# Patient Record
Sex: Male | Born: 1990 | Race: Black or African American | Hispanic: No | Marital: Single | Smoking: Never smoker
Health system: Southern US, Community
[De-identification: ages and names within clinical notes are randomized; demographics above are authoritative.]

## PROBLEM LIST (undated history)

## (undated) DIAGNOSIS — J45909 Unspecified asthma, uncomplicated: Secondary | ICD-10-CM

---

## 2020-09-21 ENCOUNTER — Other Ambulatory Visit: Payer: Self-pay

## 2020-09-21 ENCOUNTER — Emergency Department
Admission: EM | Admit: 2020-09-21 | Discharge: 2020-09-21 | Disposition: A | Discharge status: Home / Self Care | Payer: Self-pay | Attending: Emergency Medicine | Admitting: Emergency Medicine

## 2020-09-21 DIAGNOSIS — A749 Chlamydial infection, unspecified: Secondary | ICD-10-CM | POA: Insufficient documentation

## 2020-09-21 LAB — URINALYSIS, COMPLETE (UACMP) WITH MICROSCOPIC
Bacteria, UA: NONE SEEN
Bilirubin Urine: NEGATIVE
Glucose, UA: NEGATIVE mg/dL
Hgb urine dipstick: NEGATIVE
Ketones, ur: NEGATIVE mg/dL
Leukocytes,Ua: NEGATIVE
Nitrite: NEGATIVE
Protein, ur: NEGATIVE mg/dL
Specific Gravity, Urine: 1.013 (ref 1.005–1.030)
pH: 9 — ABNORMAL HIGH (ref 5.0–8.0)

## 2020-09-21 LAB — CHLAMYDIA/NGC RT PCR (ARMC ONLY)
Chlamydia Tr: DETECTED — AB
N gonorrhoeae: NOT DETECTED

## 2020-09-21 MED ORDER — DOXYCYCLINE MONOHYDRATE 100 MG PO TABS: 100.0000 mg | ORAL_TABLET | Freq: Two times a day (BID) | ORAL | 0 refills | Status: AC

## 2020-09-21 MED ORDER — DOXYCYCLINE HYCLATE 100 MG PO TABS
100.0000 mg | ORAL_TABLET | Freq: Once | ORAL | Status: AC
  Administered 2020-09-21: 100 mg via ORAL
  Filled 2020-09-21: qty 1

## 2020-09-21 MED ORDER — CEFTRIAXONE SODIUM 1 G IJ SOLR
500.0000 mg | Freq: Once | INTRAMUSCULAR | Status: AC
  Administered 2020-09-21: 500 mg via INTRAMUSCULAR
  Filled 2020-09-21: qty 10

## 2020-09-21 NOTE — Discharge Instructions (Signed)
Please take all of your medications as prescribed.  Please abstain from sexual contact until you have finished treatment.  Please follow-up with the health department if you have any additional concerns or return to the ER for any worsening.

## 2020-09-21 NOTE — ED Provider Notes (Signed)
[facility] Emergency Department Provider Note  ____________________________________________   Event Date/Time   First MD Initiated Contact with Patient 09/21/20 1507     (approximate)  I have reviewed the triage vital signs and the nursing notes.   HISTORY  Chief Complaint STD check  HPI Elijah Braun is a 30 y.o. male who presents to the emergency department for evaluation of needing an STD check.  Patient denies any symptoms or known positive contacts.  States that he would just like testing for gonorrhea and chlamydia.  Specifically he denies any fever, abdominal pain, urethral discharge, scrotal swelling or pain.  Later, his significant other joints the room and states that she was positive, and and urged him to come get tested.        History reviewed. No pertinent past medical history.  There are no problems to display for this patient.   History reviewed. No pertinent surgical history.  Prior to Admission medications   Medication Sig Start Date End Date Taking? Authorizing Provider  doxycycline (ADOXA) 100 MG tablet Take 1 tablet (100 mg total) by mouth 2 (two) times daily for 7 days. 09/21/20 09/28/20 Yes Lucy Chris, PA    Allergies Patient has no allergy information on record.  No family history on file.  Social History    Review of Systems Constitutional: No fever/chills Eyes: No visual changes. ENT: No sore throat. Cardiovascular: Denies chest pain. Respiratory: Denies shortness of breath. Gastrointestinal: No abdominal pain.  No nausea, no vomiting.  No diarrhea.  No constipation. Genitourinary: Negative for dysuria. Musculoskeletal: Negative for back pain. Skin: Negative for rash. Neurological: Negative for headaches, focal weakness or numbness.  ____________________________________________   PHYSICAL EXAM:  VITAL SIGNS: ED Triage Vitals  Enc Vitals Group     BP 09/21/20 1350 129/84     Pulse Rate 09/21/20  1350 71     Resp 09/21/20 1350 17     Temp 09/21/20 1350 98.6 F (37 C)     Temp Source 09/21/20 1350 Oral     SpO2 09/21/20 1350 96 %     Weight 09/21/20 1351 275 lb (124.7 kg)     Height 09/21/20 1351 6\' 1"  (1.854 m)     Head Circumference --      Peak Flow --      Pain Score 09/21/20 1354 0     Pain Loc --      Pain Edu? --      Excl. in GC? --    Constitutional: Alert and oriented. Well appearing and in no acute distress. Eyes: Conjunctivae are normal. PERRL. EOMI. Head: Atraumatic. Nose: No congestion/rhinnorhea. Mouth/Throat: Mucous membranes are moist.  Oropharynx non-erythematous. Neck: No stridor.   Gastrointestinal: Soft and nontender. No distention. No abdominal bruits. Musculoskeletal: No lower extremity tenderness nor edema.  No joint effusions. Skin:  Skin is warm, dry and intact. No rash noted. Psychiatric: Mood and affect are normal. Speech and behavior are normal.  ____________________________________________   LABS (all labs ordered are listed, but only abnormal results are displayed)  Labs Reviewed  CHLAMYDIA/NGC RT PCR ([facility] ONLY) - Abnormal; Notable for the following components:      Result Value   Chlamydia Tr DETECTED (*)    All other components within normal limits  URINALYSIS, COMPLETE (UACMP) WITH MICROSCOPIC - Abnormal; Notable for the following components:   Color, Urine YELLOW (*)    APPearance CLEAR (*)    pH 9.0 (*)    All other components  within normal limits   ____________________________________________   INITIAL IMPRESSION / ASSESSMENT AND PLAN / ED COURSE  As part of my medical decision making, I reviewed the following data within the electronic MEDICAL RECORD NUMBER Nursing notes reviewed and incorporated and Notes from prior ED visits        Patient is a 30 year old male who presents to the emergency department for for evaluation of needing STD check.  Patient denies any symptoms.  His test is positive for chlamydia.  We  will cover him for any other potential exposure gonorrhea given that girlfriend also tested positive for STD.  Patient is amenable with this plan.  He was advised not to dissipate in any sexual contact until he has completed the 7-day course that he is amenable with plan.  He stable this time for outpatient follow-up.      ____________________________________________   FINAL CLINICAL IMPRESSION(S) / ED DIAGNOSES  Final diagnoses:  Chlamydia     ED Discharge Orders         Ordered    doxycycline (ADOXA) 100 MG tablet  2 times daily        09/21/20 1558          *Please note:  Elijah Braun was evaluated in Emergency Department on 09/21/2020 for the symptoms described in the history of present illness. He was evaluated in the context of the global COVID-19 pandemic, which necessitated consideration that the patient might be at risk for infection with the SARS-CoV-2 virus that causes COVID-19. Institutional protocols and algorithms that pertain to the evaluation of patients at risk for COVID-19 are in a state of rapid change based on information released by regulatory bodies including the CDC and federal and state organizations. These policies and algorithms were followed during the patient's care in the ED.  Some ED evaluations and interventions may be delayed as a result of limited staffing during and the pandemic.*   Note:  This document was prepared using Dragon voice recognition software and may include unintentional dictation errors.   Lucy Chris, PA 09/21/20 1615    Jene Every, MD 09/21/20 1630

## 2020-09-21 NOTE — ED Triage Notes (Signed)
Pt comes with needing STD check. Pt states no current symptoms.   Pt states he needs a check up.

## 2020-09-29 ENCOUNTER — Other Ambulatory Visit: Payer: Self-pay

## 2020-09-29 ENCOUNTER — Emergency Department
Admission: EM | Admit: 2020-09-29 | Discharge: 2020-09-29 | Disposition: A | Discharge status: Home / Self Care | Payer: Medicaid Other | Attending: Emergency Medicine | Admitting: Emergency Medicine

## 2020-09-29 ENCOUNTER — Encounter: Payer: Self-pay | Admitting: Emergency Medicine

## 2020-09-29 DIAGNOSIS — Z139 Encounter for screening, unspecified: Secondary | ICD-10-CM

## 2020-09-29 DIAGNOSIS — Z0000 Encounter for general adult medical examination without abnormal findings: Secondary | ICD-10-CM | POA: Insufficient documentation

## 2020-09-29 NOTE — ED Triage Notes (Signed)
Pt states seen 1 week ago and was dx with STD, states was treated for STD and was told to come back in a week and see if he still had an STD.

## 2020-09-29 NOTE — ED Provider Notes (Signed)
    [facility] Emergency Department Provider Note   ____________________________________________    I have reviewed the triage vital signs and the nursing notes.   HISTORY  Chief Complaint Follow-up     HPI Elijah Braun is a 30 y.o. male who reports he was seen and treated for an STD 7 days ago, he reports compliance with his antibiotics.  He is here for recheck, he notes that he is asymptomatic and is feeling well.  History reviewed. No pertinent past medical history.  There are no problems to display for this patient.   History reviewed. No pertinent surgical history.  Prior to Admission medications   Not on File     Allergies Orange oil  History reviewed. No pertinent family history.  Social History    Review of Systems  Constitutional: No fever/chills    Gastrointestinal: No abdominal pain.  No nausea, no vomiting.   Genitourinary: Negative for dysuria.  No discharge  Skin: Negative for rash.     ____________________________________________   PHYSICAL EXAM:  VITAL SIGNS: ED Triage Vitals [09/29/20 1250]  Enc Vitals Group     BP      Pulse      Resp      Temp      Temp src      SpO2      Weight 124.7 kg (275 lb)     Height 1.854 m (6\' 1" )     Head Circumference      Peak Flow      Pain Score 0     Pain Loc      Pain Edu?      Excl. in GC?      Constitutional: Alert and oriented. No acute distress. Pleasant and interactive  Mouth/Throat: Mucous membranes are moist.   Cardiovascular: Normal rate, regular rhythm.  Respiratory: Normal respiratory effort.  No retractions.  Musculoskeletal: No lower extremity tenderness nor edema.   Neurologic:  Normal speech and language. No gross focal neurologic deficits are appreciated.   Skin:  Skin is warm, dry and intact. No rash noted.   ____________________________________________   LABS (all labs ordered are listed, but only abnormal results are  displayed)  Labs Reviewed - No data to display ____________________________________________  EKG   ____________________________________________  RADIOLOGY  None ____________________________________________   PROCEDURES  Procedure(s) performed: No  Procedures   Critical Care performed: No ____________________________________________   INITIAL IMPRESSION / ASSESSMENT AND PLAN / ED COURSE  Pertinent labs & imaging results that were available during my care of the patient were reviewed by me and considered in my medical decision making (see chart for details).  Patient is asymptomatic, compliant with his medications, no further treatment necessary   ____________________________________________   FINAL CLINICAL IMPRESSION(S) / ED DIAGNOSES  Final diagnoses:  Encounter for medical screening examination      NEW MEDICATIONS STARTED DURING THIS VISIT:  There are no discharge medications for this patient.    Note:  This document was prepared using Dragon voice recognition software and may include unintentional dictation errors.   , MD 09/29/20 [phone]

## 2020-10-14 ENCOUNTER — Emergency Department: Payer: Medicaid Other

## 2020-10-14 ENCOUNTER — Emergency Department
Admission: EM | Admit: 2020-10-14 | Discharge: 2020-10-14 | Disposition: A | Discharge status: Home / Self Care | Payer: Medicaid Other | Attending: Emergency Medicine | Admitting: Emergency Medicine

## 2020-10-14 ENCOUNTER — Other Ambulatory Visit: Payer: Self-pay

## 2020-10-14 DIAGNOSIS — M545 Low back pain, unspecified: Secondary | ICD-10-CM | POA: Insufficient documentation

## 2020-10-14 DIAGNOSIS — M549 Dorsalgia, unspecified: Secondary | ICD-10-CM

## 2020-10-14 DIAGNOSIS — Y990 Civilian activity done for income or pay: Secondary | ICD-10-CM | POA: Insufficient documentation

## 2020-10-14 DIAGNOSIS — W010XXA Fall on same level from slipping, tripping and stumbling without subsequent striking against object, initial encounter: Secondary | ICD-10-CM | POA: Insufficient documentation

## 2020-10-14 DIAGNOSIS — M25551 Pain in right hip: Secondary | ICD-10-CM | POA: Insufficient documentation

## 2020-10-14 MED ORDER — NAPROXEN 500 MG PO TABS: 500.0000 mg | ORAL_TABLET | Freq: Two times a day (BID) | ORAL | 0 refills | Status: AC

## 2020-10-14 NOTE — ED Provider Notes (Signed)
[facility] Emergency Department Provider Note   ____________________________________________   Event Date/Time   First MD Initiated Contact with Patient 10/14/20 1229     (approximate)  I have reviewed the triage vital signs and the nursing notes.   HISTORY  Chief Complaint Fall    HPI Elijah Braun is a 30 y.o. male patient complain of a trip and fall 4 days ago at work.  Patient denies LOC or head injury.  Patient has pain to the lower back and right hip.  Patient denies radicular component to his back pain.  Patient denies bladder or bowel dysfunction.  Patient rates pain as 8/10.  Patient described pain as "sore".  No palliative measure for complaint.         History reviewed. No pertinent past medical history.  There are no problems to display for this patient.   History reviewed. No pertinent surgical history.  Prior to Admission medications   Medication Sig Start Date End Date Taking? Authorizing Provider  naproxen (NAPROSYN) 500 MG tablet Take 1 tablet (500 mg total) by mouth 2 (two) times daily with a meal. 10/14/20  Yes Joni Reining, PA-C    Allergies Orange oil  No family history on file.  Social History    Review of Systems Constitutional: No fever/chills Eyes: No visual changes. ENT: No sore throat. Cardiovascular: Denies chest pain. Respiratory: Denies shortness of breath. Gastrointestinal: No abdominal pain.  No nausea, no vomiting.  No diarrhea.  No constipation. Genitourinary: Negative for dysuria. Musculoskeletal: Positive for right hip and back pain. Skin: Negative for rash. Neurological: Negative for headaches, focal weakness or numbness.   ____________________________________________   PHYSICAL EXAM:  VITAL SIGNS: ED Triage Vitals  Enc Vitals Group     BP 10/14/20 1232 (!) 135/92     Pulse Rate 10/14/20 1232 64     Resp 10/14/20 1232 19     Temp 10/14/20 1232 (!) 97.5 F (36.4 C)     Temp src --       SpO2 10/14/20 1232 98 %     Weight --      Height --      Head Circumference --      Peak Flow --      Pain Score 10/14/20 1231 8     Pain Loc --      Pain Edu? --      Excl. in GC? --    Constitutional: Alert and oriented. Well appearing and in no acute distress. Eyes: Conjunctivae are normal. PERRL. EOMI. Head: Atraumatic. Nose: No congestion/rhinnorhea. Mouth/Throat: Mucous membranes are moist.  Oropharynx non-erythematous. Neck:No cervical spine tenderness to palpation. Hematological/Lymphatic/Immunilogical: No cervical lymphadenopathy. Cardiovascular: Normal rate, regular rhythm. Grossly normal heart sounds.  Good peripheral circulation. Respiratory: Normal respiratory effort.  No retractions. Lungs CTAB. Gastrointestinal: Soft and nontender. No distention. No abdominal bruits. No CVA tenderness. Genitourinary: Deferred Musculoskeletal: No lower extremity tenderness nor edema.  No joint effusions. Neurologic:  Normal speech and language. No gross focal neurologic deficits are appreciated. No gait instability. Skin:  Skin is warm, dry and intact. No rash noted. Psychiatric: Mood and affect are normal. Speech and behavior are normal.  ____________________________________________   LABS (all labs ordered are listed, but only abnormal results are displayed)  Labs Reviewed - No data to display ____________________________________________  EKG   ____________________________________________  RADIOLOGY I, Joni Reining, personally viewed and evaluated these images (plain radiographs) as part of my medical decision making, as well as  reviewing the written report by the radiologist.  ED MD interpretation: No acute findings on x-ray of the right hip.  Official radiology report(s): DG Hip Unilat W or Wo Pelvis 2-3 Views Right  Result Date: 10/14/2020 CLINICAL DATA:  Follow-up work.  Right hip and lower back pain. EXAM: DG HIP (WITH OR WITHOUT PELVIS) 2-3V RIGHT  COMPARISON:  None. FINDINGS: No definite fracture. SI joints and symphysis pubis unremarkable. Small fragment along the roof of the right acetabulum laterally is probably an os acetabuli right femoral neck intact. IMPRESSION: No definite findings of acute fracture. 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 Electronically Signed   By: Kennith Center M.D.   On: 10/14/2020 13:25    ____________________________________________   PROCEDURES  Procedure(s) performed (including Critical Care):  Procedures   ____________________________________________   INITIAL IMPRESSION / ASSESSMENT AND PLAN / ED COURSE  As part of my medical decision making, I reviewed the following data within the electronic MEDICAL RECORD NUMBER         Patient complain right lateral back and right hip pain secondary to a trip and fall 4 days ago.  Discussed no acute findings x-rays with patient.  Patient complaining physical exam consistent muscle skeletal pain secondary to fall.  Patient given discharge care instruction work note.  Patient advised take anti-inflammatory medication as directed.  Patient [facility].      ____________________________________________   FINAL CLINICAL IMPRESSION(S) / ED DIAGNOSES  Final diagnoses:  Musculoskeletal back pain     ED Discharge Orders         Ordered    naproxen (NAPROSYN) 500 MG tablet  2 times daily with meals        10/14/20 1338          *Please note:  Elijah Braun was evaluated in Emergency Department on 10/14/2020 for the symptoms described in the history of present illness. He was evaluated in the context of the global COVID-19 pandemic, which necessitated consideration that the patient might be at risk for infection with the SARS-CoV-2 virus that causes COVID-19. Institutional protocols and algorithms that pertain to the evaluation of patients at risk for COVID-19 are in a state of rapid change based on information released by  regulatory bodies including the CDC and federal and state organizations. These policies and algorithms were followed during the patient's care in the ED.  Some ED evaluations and interventions may be delayed as a result of limited staffing during and the pandemic.*   Note:  This document was prepared using Dragon voice recognition software and may include unintentional dictation errors.    Joni Reining, PA-C 10/14/20 1340    Minna Antis, MD 10/14/20 1529

## 2020-10-14 NOTE — Discharge Instructions (Signed)
No acute findings on x-ray of the right hip.  Read and follow discharge care instruction.  Take medication as needed.

## 2020-10-14 NOTE — ED Notes (Signed)
See triage note  States he tripped and fell on Friday     Having lower back pain into right hip

## 2020-10-14 NOTE — ED Triage Notes (Signed)
Pt comes with c/o trip and fall last Friday while at work. Pt states this was already filed at work for workers comp.  Pt states no head injury. Pt states lower back pain.

## 2020-10-29 ENCOUNTER — Other Ambulatory Visit: Payer: Self-pay

## 2020-10-29 ENCOUNTER — Emergency Department
Admission: EM | Admit: 2020-10-29 | Discharge: 2020-10-29 | Disposition: A | Discharge status: Home / Self Care | Payer: Medicaid Other | Attending: Emergency Medicine | Admitting: Emergency Medicine

## 2020-10-29 DIAGNOSIS — Z20822 Contact with and (suspected) exposure to covid-19: Secondary | ICD-10-CM | POA: Insufficient documentation

## 2020-10-29 DIAGNOSIS — R197 Diarrhea, unspecified: Secondary | ICD-10-CM | POA: Insufficient documentation

## 2020-10-29 DIAGNOSIS — R112 Nausea with vomiting, unspecified: Secondary | ICD-10-CM | POA: Insufficient documentation

## 2020-10-29 LAB — COMPREHENSIVE METABOLIC PANEL
ALT: 30 U/L (ref 0–44)
AST: 21 U/L (ref 15–41)
Albumin: 3.7 g/dL (ref 3.5–5.0)
Alkaline Phosphatase: 77 U/L (ref 38–126)
Anion gap: 7 (ref 5–15)
BUN: 13 mg/dL (ref 6–20)
CO2: 25 mmol/L (ref 22–32)
Calcium: 9.3 mg/dL (ref 8.9–10.3)
Chloride: 108 mmol/L (ref 98–111)
Creatinine, Ser: 1.14 mg/dL (ref 0.61–1.24)
GFR, Estimated: >60 mL/min (ref >60)
Glucose, Bld: 118 mg/dL — ABNORMAL HIGH (ref 70–99)
Potassium: 3.8 mmol/L (ref 3.5–5.1)
Sodium: 140 mmol/L (ref 135–145)
Total Bilirubin: 0.5 mg/dL (ref 0.3–1.2)
Total Protein: 7.5 g/dL (ref 6.5–8.1)

## 2020-10-29 LAB — URINALYSIS, COMPLETE (UACMP) WITH MICROSCOPIC
Bacteria, UA: NONE SEEN
Bilirubin Urine: NEGATIVE
Glucose, UA: NEGATIVE mg/dL
Hgb urine dipstick: NEGATIVE
Ketones, ur: NEGATIVE mg/dL
Leukocytes,Ua: NEGATIVE
Nitrite: NEGATIVE
Protein, ur: NEGATIVE mg/dL
Specific Gravity, Urine: 1.013 (ref 1.005–1.030)
pH: 5 (ref 5.0–8.0)

## 2020-10-29 LAB — CBC
HCT: 40 % (ref 39.0–52.0)
Hemoglobin: 13.4 g/dL (ref 13.0–17.0)
MCH: 27.6 pg (ref 26.0–34.0)
MCHC: 33.5 g/dL (ref 30.0–36.0)
MCV: 82.5 fL (ref 80.0–100.0)
Platelets: 263 10*3/uL (ref 150–400)
RBC: 4.85 MIL/uL (ref 4.22–5.81)
RDW: 14.2 % (ref 11.5–15.5)
WBC: 7.1 10*3/uL (ref 4.0–10.5)
nRBC: 0 % (ref 0.0–0.2)

## 2020-10-29 LAB — LIPASE, BLOOD: Lipase: 39 U/L (ref 11–51)

## 2020-10-29 LAB — RESP PANEL BY RT-PCR (FLU A&B, COVID) ARPGX2
Influenza A by PCR: NEGATIVE
Influenza B by PCR: NEGATIVE
SARS Coronavirus 2 by RT PCR: NEGATIVE

## 2020-10-29 MED ORDER — LACTATED RINGERS IV BOLUS
1000.0000 mL | Freq: Once | INTRAVENOUS | Status: AC
  Administered 2020-10-29: 1000 mL via INTRAVENOUS

## 2020-10-29 MED ORDER — ONDANSETRON 4 MG PO TBDP: 4.0000 mg | ORAL_TABLET | Freq: Three times a day (TID) | ORAL | 0 refills | Status: AC | PRN

## 2020-10-29 MED ORDER — ONDANSETRON HCL 4 MG/2ML IJ SOLN
4.0000 mg | Freq: Once | INTRAMUSCULAR | Status: AC
  Administered 2020-10-29: 4 mg via INTRAVENOUS
  Filled 2020-10-29: qty 2

## 2020-10-29 MED ORDER — IBUPROFEN 600 MG PO TABS: 600.0000 mg | ORAL_TABLET | Freq: Three times a day (TID) | ORAL | 0 refills | Status: AC | PRN

## 2020-10-29 NOTE — ED Triage Notes (Signed)
Pt c/o N/V/D for the past couple of days, denies any abd pain... pt is ambulatory with a steady gait

## 2020-10-29 NOTE — ED Provider Notes (Addendum)
[facility] Emergency Department Provider Note  ____________________________________________   Event Date/Time   First MD Initiated Contact with Patient 10/29/20 1001     (approximate)  I have reviewed the triage vital signs and the nursing notes.   HISTORY  Chief Complaint Emesis and Diarrhea    HPI Elijah Braun is a 30 y.o. male here with nausea, vomiting, diarrhea.  The patient states that for the last 2 days, he has had nausea, vomiting, diarrhea.  He said diffuse, mild, abdominal cramping.  He states he has been very fatigued.  He has no known sick contacts.  He has not taken anything for symptoms.  He states that he is here because he would like to be checked out.  Denies any recent travel.  He actually feels somewhat better today and has been able to tolerate some p.o. intake.  No other complaints.        History reviewed. No pertinent past medical history.  There are no problems to display for this patient.   History reviewed. No pertinent surgical history.  Prior to Admission medications   Medication Sig Start Date End Date Taking? Authorizing Provider  naproxen (NAPROSYN) 500 MG tablet Take 1 tablet (500 mg total) by mouth 2 (two) times daily with a meal. 10/14/20   Joni Reining, PA-C    Allergies Orange oil  No family history on file.  Social History Social History   Tobacco Use  . Smoking status: Never Smoker  . Smokeless tobacco: Never Used    Review of Systems  Review of Systems  Constitutional: Positive for fatigue. Negative for chills and fever.  HENT: Negative for sore throat.   Respiratory: Negative for shortness of breath.   Cardiovascular: Negative for chest pain.  Gastrointestinal: Positive for abdominal pain, diarrhea, nausea and vomiting.  Genitourinary: Negative for flank pain.  Musculoskeletal: Negative for neck pain.  Skin: Negative for rash and wound.  Allergic/Immunologic: Negative for  immunocompromised state.  Neurological: Positive for weakness. Negative for numbness.  Hematological: Does not bruise/bleed easily.  All other systems reviewed and are negative.    ____________________________________________  PHYSICAL EXAM:      VITAL SIGNS: ED Triage Vitals  Enc Vitals Group     BP 10/29/20 0812 (!) 128/91     Pulse Rate 10/29/20 0812 65     Resp 10/29/20 0812 18     Temp 10/29/20 0812 98 F (36.7 C)     Temp Source 10/29/20 0812 Oral     SpO2 10/29/20 0812 97 %     Weight --      Height --      Head Circumference --      Peak Flow --      Pain Score 10/29/20 0809 0     Pain Loc --      Pain Edu? --      Excl. in GC? --      Physical Exam Vitals and nursing note reviewed.  Constitutional:      General: He is not in acute distress.    Appearance: He is well-developed.  HENT:     Head: Normocephalic and atraumatic.  Eyes:     Conjunctiva/sclera: Conjunctivae normal.  Cardiovascular:     Rate and Rhythm: Normal rate and regular rhythm.     Heart sounds: Normal heart sounds. No murmur heard. No friction rub.  Pulmonary:     Effort: Pulmonary effort is normal. No respiratory distress.     Breath  sounds: Normal breath sounds. No wheezing or rales.  Abdominal:     General: There is no distension.     Palpations: Abdomen is soft.     Tenderness: There is no abdominal tenderness (Minimal, generalized, no focal tenderness).  Musculoskeletal:     Cervical back: Neck supple.  Skin:    General: Skin is warm.     Capillary Refill: Capillary refill takes less than 2 seconds.  Neurological:     Mental Status: He is alert and oriented to person, place, and time.     Motor: No abnormal muscle tone.       ____________________________________________   LABS (all labs ordered are listed, but only abnormal results are displayed)  Labs Reviewed  COMPREHENSIVE METABOLIC PANEL - Abnormal; Notable for the following components:      Result Value    Glucose, Bld 118 (*)    All other components within normal limits  URINALYSIS, COMPLETE (UACMP) WITH MICROSCOPIC - Abnormal; Notable for the following components:   Color, Urine YELLOW (*)    APPearance CLEAR (*)    All other components within normal limits  RESP PANEL BY RT-PCR (FLU A&B, COVID) ARPGX2  LIPASE, BLOOD  CBC    ____________________________________________  EKG:  ________________________________________  RADIOLOGY All imaging, including plain films, CT scans, and ultrasounds, independently reviewed by me, and interpretations confirmed via formal radiology reads.  ED MD interpretation:     Official radiology report(s): No results found.  ____________________________________________  PROCEDURES   Procedure(s) performed (including Critical Care):  Procedures  ____________________________________________  INITIAL IMPRESSION / MDM / ASSESSMENT AND PLAN / ED COURSE  As part of my medical decision making, I reviewed the following data within the electronic MEDICAL RECORD NUMBER Nursing notes reviewed and incorporated, Old chart reviewed, Notes from prior ED visits, and [place] Controlled Substance Database       *Elijah Braun was evaluated in Emergency Department on 10/29/2020 for the symptoms described in the history of present illness. He was evaluated in the context of the global COVID-19 pandemic, which necessitated consideration that the patient might be at risk for infection with the SARS-CoV-2 virus that causes COVID-19. Institutional protocols and algorithms that pertain to the evaluation of patients at risk for COVID-19 are in a state of rapid change based on information released by regulatory bodies including the CDC and federal and state organizations. These policies and algorithms were followed during the patient's care in the ED.  Some ED evaluations and interventions may be delayed as a result of limited staffing during the pandemic.*     Medical Decision  Making: Well-appearing 30 year old male here with generalized abdominal pain, nausea, and vomiting.  Abdomen is soft and nontender on my exam.  Lab work reviewed, is very reassuring.  He has no significant leukocytosis.  CMP unremarkable.  Renal function at baseline.  UA without signs of UTI or stone.  COVID negative.  Patient feels much better after symptomatic treatment.  He has no focal tenderness on exam to suggest appendicitis, cholecystitis, or other surgical abnormality.  Patient will be discharged with supportive care and outpatient follow-up.  ____________________________________________  FINAL CLINICAL IMPRESSION(S) / ED DIAGNOSES  Final diagnoses:  Nausea vomiting and diarrhea     MEDICATIONS GIVEN DURING THIS VISIT:  Medications  lactated ringers bolus 1,000 mL (0 mLs Intravenous Stopped 10/29/20 1205)  ondansetron (ZOFRAN) injection 4 mg (4 mg Intravenous Given 10/29/20 1050)     ED Discharge Orders    None  Note:  This document was prepared using Dragon voice recognition software and may include unintentional dictation errors.   Shaune Pollack, MD 10/29/20 1248    Shaune Pollack, MD 10/29/20 1249

## 2020-11-29 ENCOUNTER — Emergency Department
Admission: EM | Admit: 2020-11-29 | Discharge: 2020-11-29 | Disposition: A | Discharge status: Home / Self Care | Payer: Medicaid Other | Attending: Emergency Medicine | Admitting: Emergency Medicine

## 2020-11-29 ENCOUNTER — Other Ambulatory Visit: Payer: Self-pay

## 2020-11-29 DIAGNOSIS — Z5321 Procedure and treatment not carried out due to patient leaving prior to being seen by health care provider: Secondary | ICD-10-CM | POA: Insufficient documentation

## 2020-11-29 DIAGNOSIS — R109 Unspecified abdominal pain: Secondary | ICD-10-CM | POA: Insufficient documentation

## 2020-11-29 DIAGNOSIS — R197 Diarrhea, unspecified: Secondary | ICD-10-CM | POA: Insufficient documentation

## 2020-11-29 HISTORY — DX: Unspecified asthma, uncomplicated: J45.909

## 2020-11-29 LAB — COMPREHENSIVE METABOLIC PANEL
ALT: 77 U/L — ABNORMAL HIGH (ref 0–44)
AST: 35 U/L (ref 15–41)
Albumin: 4.4 g/dL (ref 3.5–5.0)
Alkaline Phosphatase: 79 U/L (ref 38–126)
Anion gap: 9 (ref 5–15)
BUN: 8 mg/dL (ref 6–20)
CO2: 25 mmol/L (ref 22–32)
Calcium: 9.5 mg/dL (ref 8.9–10.3)
Chloride: 106 mmol/L (ref 98–111)
Creatinine, Ser: 1.04 mg/dL (ref 0.61–1.24)
GFR, Estimated: >60 mL/min (ref >60)
Glucose, Bld: 130 mg/dL — ABNORMAL HIGH (ref 70–99)
Potassium: 3.7 mmol/L (ref 3.5–5.1)
Sodium: 140 mmol/L (ref 135–145)
Total Bilirubin: 0.5 mg/dL (ref 0.3–1.2)
Total Protein: 8.7 g/dL — ABNORMAL HIGH (ref 6.5–8.1)

## 2020-11-29 LAB — URINALYSIS, COMPLETE (UACMP) WITH MICROSCOPIC
Bacteria, UA: NONE SEEN
Bilirubin Urine: NEGATIVE
Glucose, UA: NEGATIVE mg/dL
Ketones, ur: NEGATIVE mg/dL
Leukocytes,Ua: NEGATIVE
Nitrite: NEGATIVE
Protein, ur: NEGATIVE mg/dL
Specific Gravity, Urine: 1.012 (ref 1.005–1.030)
Squamous Epithelial / HPF: NONE SEEN (ref 0–5)
pH: 5 (ref 5.0–8.0)

## 2020-11-29 LAB — CBC
HCT: 44.5 % (ref 39.0–52.0)
Hemoglobin: 14.5 g/dL (ref 13.0–17.0)
MCH: 27.6 pg (ref 26.0–34.0)
MCHC: 32.6 g/dL (ref 30.0–36.0)
MCV: 84.8 fL (ref 80.0–100.0)
Platelets: 311 10*3/uL (ref 150–400)
RBC: 5.25 MIL/uL (ref 4.22–5.81)
RDW: 13.9 % (ref 11.5–15.5)
WBC: 6.7 10*3/uL (ref 4.0–10.5)
nRBC: 0 % (ref 0.0–0.2)

## 2020-11-29 LAB — LIPASE, BLOOD: Lipase: 27 U/L (ref 11–51)

## 2020-11-29 NOTE — ED Notes (Signed)
No answer when called for vital signs recheck.

## 2020-11-29 NOTE — ED Triage Notes (Signed)
Pt to ED POV for right sided abd pain that started 2 days ago. Started having diarrhea yesterday.  NAD noted

## 2021-07-09 IMAGING — CR DG HIP (WITH OR WITHOUT PELVIS) 2-3V*R*
3 series · 3 of 3 positions shown · non-contrast
Comparison: None.

CLINICAL DATA: Follow-up work.  Right hip and lower back pain.

EXAM:
DG HIP (WITH OR WITHOUT PELVIS) 2-3V RIGHT

[pelvis ap]
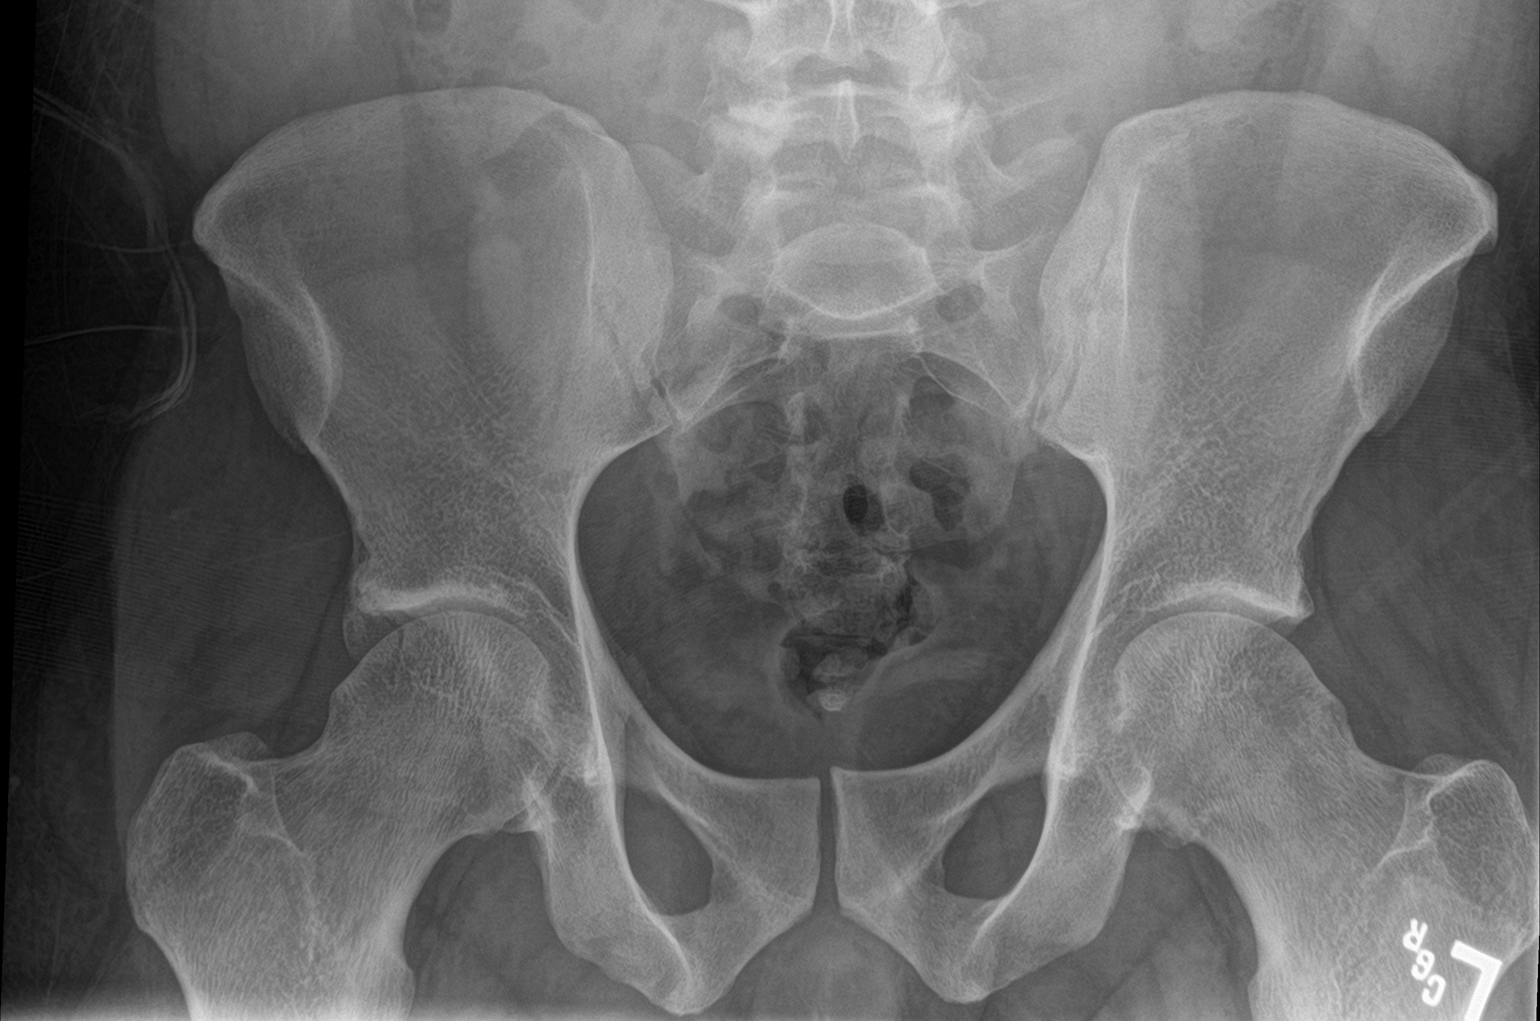

[hip ap]
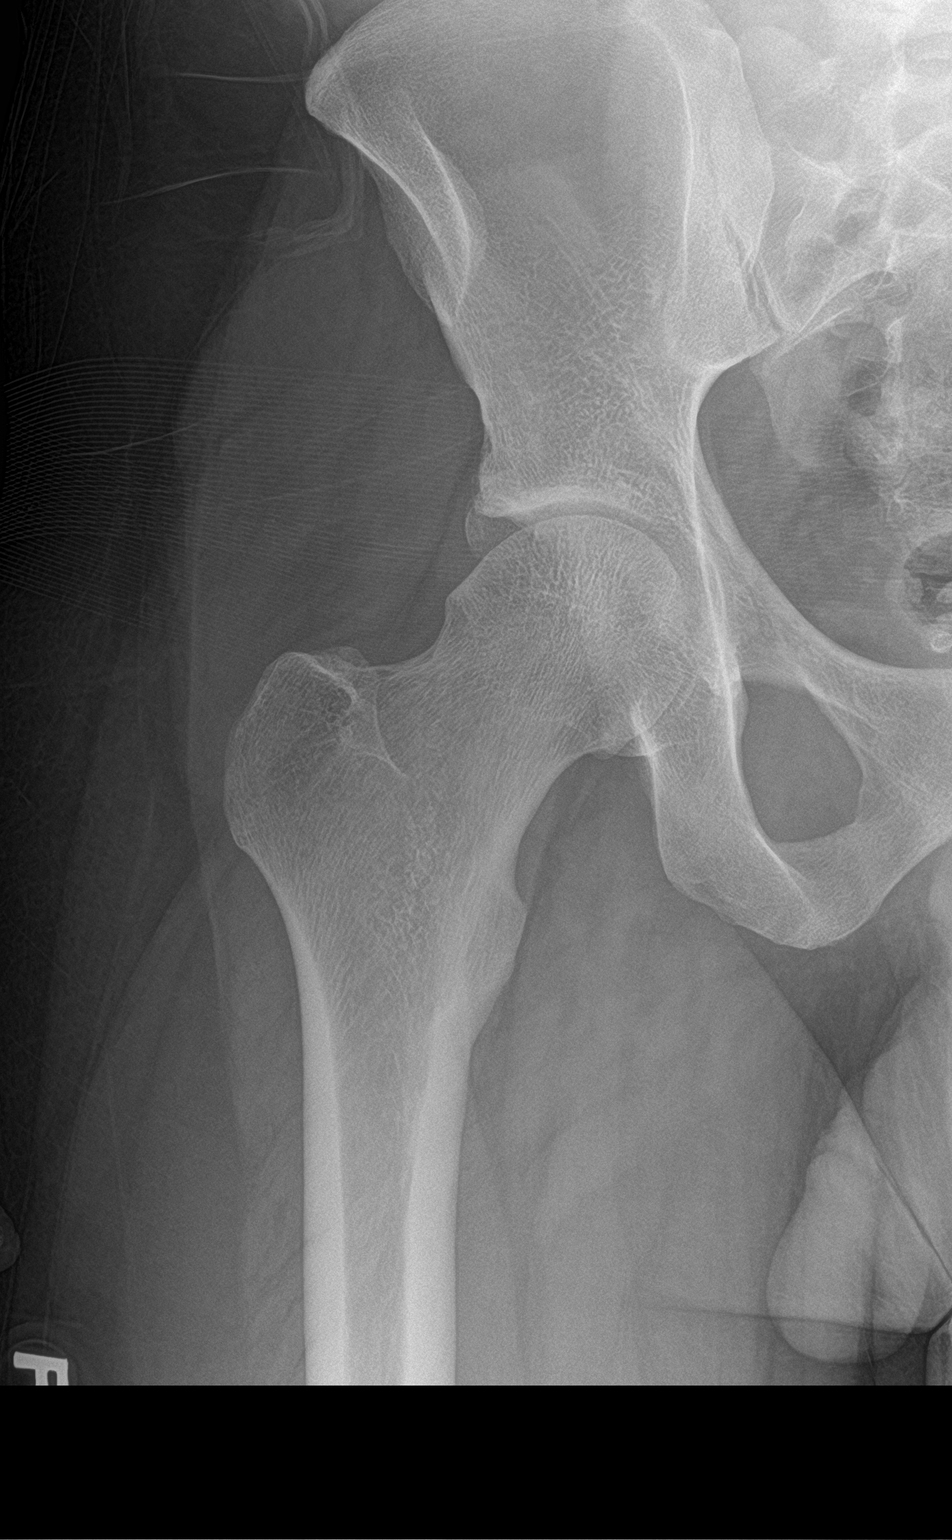

[hip lat]
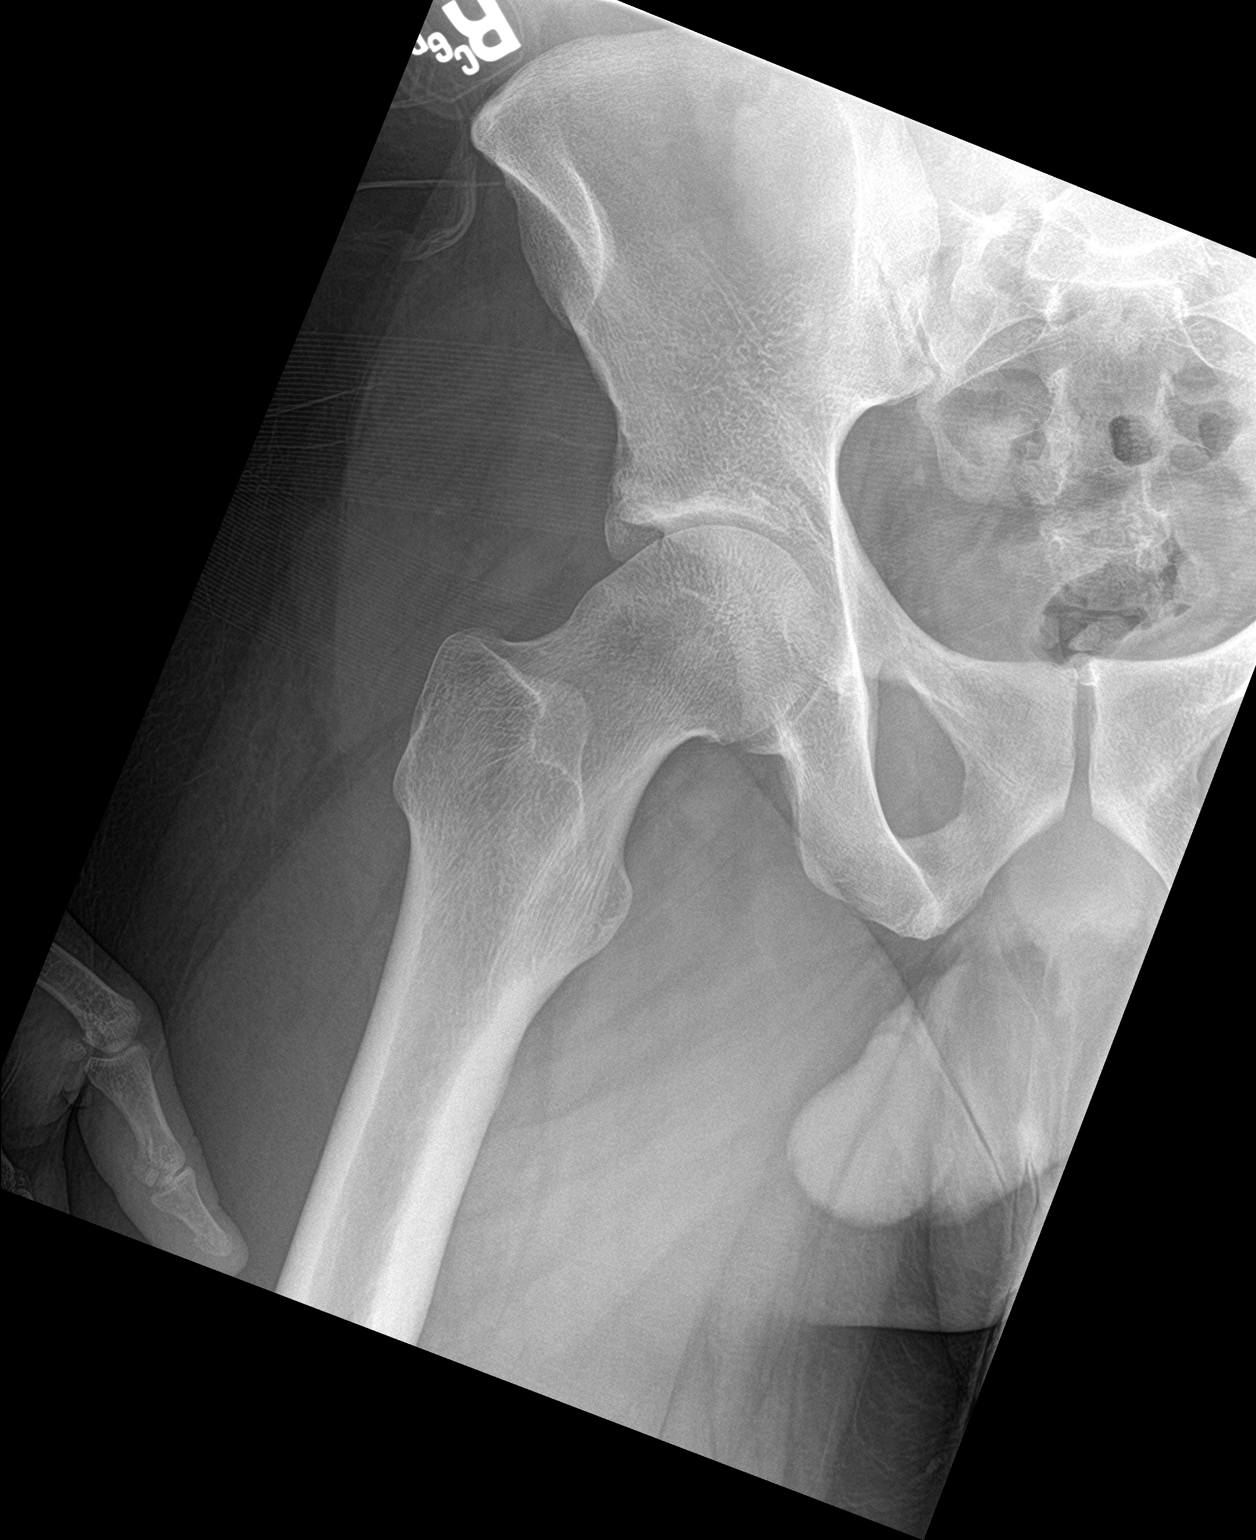

[3 of 3 positions shown; findings below may reference images not displayed]

FINDINGS: No definite fracture. SI joints and symphysis pubis unremarkable.
Small fragment along the roof of the right acetabulum laterally is
probably an os acetabuli right femoral neck intact.
IMPRESSION: No definite findings of acute fracture.

Probable os acetabuli on the right.
